# Patient Record
Sex: Male | Born: 1960 | Hispanic: Yes | Marital: Married | Smoking: Never smoker
Health system: Southern US, Community
[De-identification: ages and names within clinical notes are randomized; demographics above are authoritative.]

## PROBLEM LIST (undated history)

## (undated) DIAGNOSIS — G44221 Chronic tension-type headache, intractable: Secondary | ICD-10-CM

## (undated) DIAGNOSIS — I10 Essential (primary) hypertension: Secondary | ICD-10-CM

## (undated) DIAGNOSIS — D126 Benign neoplasm of colon, unspecified: Secondary | ICD-10-CM

## (undated) DIAGNOSIS — C189 Malignant neoplasm of colon, unspecified: Secondary | ICD-10-CM

## (undated) DIAGNOSIS — M19049 Primary osteoarthritis, unspecified hand: Secondary | ICD-10-CM

## (undated) DIAGNOSIS — K219 Gastro-esophageal reflux disease without esophagitis: Secondary | ICD-10-CM

## (undated) DIAGNOSIS — M5481 Occipital neuralgia: Secondary | ICD-10-CM

## (undated) DIAGNOSIS — M791 Myalgia, unspecified site: Secondary | ICD-10-CM

## (undated) DIAGNOSIS — R519 Headache, unspecified: Secondary | ICD-10-CM

## (undated) DIAGNOSIS — IMO0002 Reserved for concepts with insufficient information to code with codable children: Secondary | ICD-10-CM

## (undated) DIAGNOSIS — M1990 Unspecified osteoarthritis, unspecified site: Secondary | ICD-10-CM

## (undated) DIAGNOSIS — G709 Myoneural disorder, unspecified: Secondary | ICD-10-CM

## (undated) HISTORY — DX: Myalgia, unspecified site: M79.10

## (undated) HISTORY — DX: Reserved for concepts with insufficient information to code with codable children: IMO0002

## (undated) HISTORY — PX: COLON SURGERY: SHX602

## (undated) HISTORY — PX: LAPAROSCOPIC PARTIAL COLECTOMY: SHX5907

---

## 2004-07-13 ENCOUNTER — Ambulatory Visit: Payer: Self-pay | Admitting: Oncology

## 2004-08-05 ENCOUNTER — Ambulatory Visit: Payer: Self-pay | Admitting: Oncology

## 2005-01-01 ENCOUNTER — Ambulatory Visit: Payer: Self-pay | Admitting: Oncology

## 2005-01-03 ENCOUNTER — Ambulatory Visit: Payer: Self-pay | Admitting: Oncology

## 2005-01-28 ENCOUNTER — Ambulatory Visit: Payer: Self-pay | Admitting: Unknown Physician Specialty

## 2005-05-03 ENCOUNTER — Ambulatory Visit: Payer: Self-pay | Admitting: Oncology

## 2005-05-05 ENCOUNTER — Ambulatory Visit: Payer: Self-pay | Admitting: Oncology

## 2005-11-01 ENCOUNTER — Ambulatory Visit: Payer: Self-pay | Admitting: Oncology

## 2005-11-03 ENCOUNTER — Ambulatory Visit: Payer: Self-pay | Admitting: Oncology

## 2006-05-30 ENCOUNTER — Ambulatory Visit: Payer: Self-pay | Admitting: Oncology

## 2006-06-05 ENCOUNTER — Ambulatory Visit: Payer: Self-pay | Admitting: Oncology

## 2006-11-04 ENCOUNTER — Ambulatory Visit: Payer: Self-pay | Admitting: Oncology

## 2006-11-28 ENCOUNTER — Ambulatory Visit: Payer: Self-pay | Admitting: Oncology

## 2006-12-04 ENCOUNTER — Ambulatory Visit: Payer: Self-pay | Admitting: Oncology

## 2007-07-06 ENCOUNTER — Ambulatory Visit: Payer: Self-pay | Admitting: Oncology

## 2007-07-10 ENCOUNTER — Ambulatory Visit: Payer: Self-pay | Admitting: Oncology

## 2007-07-10 ENCOUNTER — Ambulatory Visit: Payer: Self-pay | Admitting: Physician Assistant

## 2007-08-06 ENCOUNTER — Ambulatory Visit: Payer: Self-pay | Admitting: Oncology

## 2007-10-04 ENCOUNTER — Ambulatory Visit: Payer: Self-pay | Admitting: Oncology

## 2008-01-04 ENCOUNTER — Ambulatory Visit: Payer: Self-pay | Admitting: Oncology

## 2008-01-20 ENCOUNTER — Ambulatory Visit: Payer: Self-pay | Admitting: Oncology

## 2008-02-03 ENCOUNTER — Ambulatory Visit: Payer: Self-pay | Admitting: Oncology

## 2008-05-05 ENCOUNTER — Ambulatory Visit: Payer: Self-pay | Admitting: Oncology

## 2008-05-16 ENCOUNTER — Ambulatory Visit: Payer: Self-pay | Admitting: Gastroenterology

## 2008-07-05 ENCOUNTER — Ambulatory Visit: Payer: Self-pay | Admitting: Oncology

## 2008-07-18 ENCOUNTER — Ambulatory Visit: Payer: Self-pay | Admitting: Oncology

## 2008-08-05 ENCOUNTER — Ambulatory Visit: Payer: Self-pay | Admitting: Oncology

## 2009-01-03 ENCOUNTER — Ambulatory Visit: Payer: Self-pay | Admitting: Oncology

## 2009-01-18 ENCOUNTER — Ambulatory Visit: Payer: Self-pay | Admitting: Oncology

## 2009-02-02 ENCOUNTER — Ambulatory Visit: Payer: Self-pay | Admitting: Oncology

## 2010-06-08 ENCOUNTER — Ambulatory Visit: Payer: Self-pay | Admitting: Oncology

## 2010-07-05 ENCOUNTER — Ambulatory Visit: Payer: Self-pay | Admitting: Oncology

## 2010-07-17 ENCOUNTER — Ambulatory Visit: Payer: Self-pay | Admitting: Gastroenterology

## 2010-08-05 DIAGNOSIS — C189 Malignant neoplasm of colon, unspecified: Secondary | ICD-10-CM

## 2010-08-05 HISTORY — DX: Malignant neoplasm of colon, unspecified: C18.9

## 2013-12-23 ENCOUNTER — Ambulatory Visit (INDEPENDENT_AMBULATORY_CARE_PROVIDER_SITE_OTHER): Payer: Managed Care, Other (non HMO)

## 2013-12-23 VITALS — BP 131/75 | HR 59 | Resp 18

## 2013-12-23 DIAGNOSIS — B351 Tinea unguium: Secondary | ICD-10-CM

## 2013-12-23 DIAGNOSIS — L299 Pruritus, unspecified: Secondary | ICD-10-CM

## 2013-12-23 DIAGNOSIS — B353 Tinea pedis: Secondary | ICD-10-CM

## 2013-12-23 MED ORDER — EFINACONAZOLE 10 % EX SOLN: CUTANEOUS | Status: AC

## 2013-12-23 MED ORDER — LULICONAZOLE 1 % EX CREA: TOPICAL_CREAM | CUTANEOUS | Status: AC

## 2013-12-23 NOTE — Progress Notes (Signed)
   Subjective:    Patient ID: Colton Johnston, male    DOB: [birth year], 53 y.o.   MRN: [id number]  HPI I have some nails that look discolored and thick and my feet and hands itch and some burning and throbbing and if it gets hot outside that is really when it gets bad    Review of Systems  All other systems reviewed and are negative.      Objective:   Physical Exam Neurovascular status is intact with pedal pulses palpable DP and PT posterior were for capillary refill time 3 seconds epicritic and proprioceptive sensations intact and symmetric bilateral there is normal plantar response DTRs not elicited dermatologically skin color pigment normal hair growth absent distally although intact and legs and ankle areas nails there is some thickening discoloration and brittleness hallux second fourth and fifth nails on the left second fourth and fifth nails on the right multiple nails are involved thickening yellowing discoloration and brittleness and tenderness also adjacent to the nails the skin and the webspaces and oxygen saturation of both feet is significant erythema and pruritus no open wounds no blistering no secondary infections or ulcers are noted patient also having some irritation to the nails on his hands and posse some right is as well maybe a hand infection due to him scratching his feet with secondary contamination may address this is secondary infection at the same time with current medications. Orthopedic biomechanical exam unremarkable neurologic exam unremarkable patient does have some neck or back problems been taking some pain medications no other new changes or findings noted       Assessment & Plan:  Assessment onychomycosis as well as chronic tinea pedis with pruritus plan at this time prescribed topical antifungals for nail and for skin prescription for Jublia and Luzu are given at this time we've sent the patient home directly advised to management medications as  instructed to 4 weeks for the cream for skin 12 months for fungal nail treatment followup in 6 months or as needed if there is any exacerbation or for possible followup in 6 months to assess progress next  [employer] DPM

## 2013-12-23 NOTE — Patient Instructions (Signed)
Onychomycosis/Fungal Toenails  WHAT IS IT? An infection that lies within the keratin of your nail plate that is caused by a fungus.  WHY ME? Fungal infections affect all ages, sexes, races, and creeds.  There may be many factors that predispose you to a fungal infection such as age, coexisting medical conditions such as diabetes, or an autoimmune disease; stress, medications, fatigue, genetics, etc.  Bottom line: fungus thrives in a warm, moist environment and your shoes offer such a location.  IS IT CONTAGIOUS? Theoretically, yes.  You do not want to share shoes, nail clippers or files with someone who has fungal toenails.  Walking around barefoot in the same room or sleeping in the same bed is unlikely to transfer the organism.  It is important to realize, however, that fungus can spread easily from one nail to the next on the same foot.  HOW DO WE TREAT THIS?  There are several ways to treat this condition.  Treatment may depend on many factors such as age, medications, pregnancy, liver and kidney conditions, etc.  It is best to ask your doctor which options are available to you.  1. No treatment.   Unlike many other medical concerns, you can live with this condition.  However for many people this can be a painful condition and may lead to ingrown toenails or a bacterial infection.  It is recommended that you keep the nails cut short to help reduce the amount of fungal nail. 2. Topical treatment.  These range from herbal remedies to prescription strength nail lacquers.  About 40-50% effective, topicals require twice daily application for approximately 9 to 12 months or until an entirely new nail has grown out.  The most effective topicals are medical grade medications available through physicians offices. 3. Oral antifungal medications.  With an 80-90% cure rate, the most common oral medication requires 3 to 4 months of therapy and stays in your system for a year as the new nail grows out.  Oral  antifungal medications do require blood work to make sure it is a safe drug for you.  A liver function panel will be performed prior to starting the medication and after the first month of treatment.  It is important to have the blood work performed to avoid any harmful side effects.  In general, this medication safe but blood work is required. 4. Laser Therapy.  This treatment is performed by applying a specialized laser to the affected nail plate.  This therapy is noninvasive, fast, and non-painful.  It is not covered by insurance and is therefore, out of pocket.  The results have been very good with a 80-95% cure rate.  [facility] is the only practice in the area to offer this therapy. 5. Permanent Nail Avulsion.  Removing the entire nail so that a new nail will not grow back.  Applied at topical nail antifungal Jublia to each affected toenail once daily for 12 months Applied to Luzu antifungal cream to the affected areas of both feet twice daily for 2-4 weeks  Discontinue any the products if the cause any irritation to the skin and contact our office

## 2014-12-29 ENCOUNTER — Ambulatory Visit (INDEPENDENT_AMBULATORY_CARE_PROVIDER_SITE_OTHER): Payer: Managed Care, Other (non HMO) | Admitting: Podiatrist

## 2014-12-29 ENCOUNTER — Encounter: Payer: Self-pay | Admitting: Podiatrist

## 2014-12-29 VITALS — BP 124/77 | HR 88 | Resp 18

## 2014-12-29 DIAGNOSIS — L600 Ingrowing nail: Secondary | ICD-10-CM | POA: Diagnosis not present

## 2014-12-29 NOTE — Patient Instructions (Signed)
You may choose your soak instructions below-- either betadine or antibacterial soap-- both work well .  Keep the toe clean and keep a bandage on it daily for 2 weeks.  May leave open to air after 1 week.   Betadine Soak Instructions  Purchase an 8 oz. bottle of BETADINE solution (Povidone)  THE DAY AFTER THE PROCEDURE  Place 1 tablespoon of betadine solution in a quart of warm tap water.  Submerge your foot or feet with outer bandage intact for the initial soak; this will allow the bandage to become moist and wet for easy lift off.  Once you remove your bandage, continue to soak in the solution for 20 minutes.  This soak should be done twice a day.  Next, remove your foot or feet from solution, blot dry the affected area and cover.  You may use a band aid large enough to cover the area or use gauze and tape.  Apply other medications to the area as directed by the doctor such as cortisporin otic solution (ear drops) or neosporin.  IF YOUR SKIN BECOMES IRRITATED WHILE USING THESE INSTRUCTIONS, IT IS OKAY TO SWITCH TO EPSOM SALTS AND WATER OR WHITE VINEGAR AND WATER.  ANTIBACTERIAL SOAP INSTRUCTIONS  THE DAY AFTER PROCEDURE   For the first dressing change (soak) Place 3-4 drops of antibacterial liquid soap in a quart of warm tap water.  Submerge foot into water for 20 minutes.  If bandage was applied after your procedure, leave on to allow for easy lift off, then remove and continue with soak for the remaining time.  Next, blot area dry with a soft cloth and apply antibiotic ointment such as polysporin, neosporin, or triple antibiotic ointment.  You may then switch to the instructions below    Shower as usual. Before getting out, place a drop of antibacterial liquid soap (Dial) on a wet, clean washcloth.  Gently wipe washcloth over affected area.  Afterward, rinse the area with warm water.  Blot the area dry with a soft cloth and cover with antibiotic ointment (neosporin, polysporin, bacitracin)  and band aid or gauze and tape    Long Term Care Instructions-Post Nail Surgery  You have had your ingrown toenail and root treated with a chemical.  This chemical causes a burn that will drain and ooze like a blister.  1-2 weeks after the procedure you may leave the area open to air at night to help dry it up.  During the day,  It is important to keep this area clean and covered until the toe dries out and forms a scab. Once the scab forms you no longer need to soak or apply a dressing.  If at any time you experience an increase in pain, redness, swelling, or drainage, you should contact the office as soon as possible.

## 2014-12-29 NOTE — Progress Notes (Signed)
   Chief Complaint  Patient presents with  . Ingrown Toenail    i have an ingrown toenail on the left big toe and i have trimmed on it and has been going on for about 4 months and is sore and draining and is tender and hurts with shoes        Review of Systems  DATA OBTAINED: from patient  GENERAL: Feels well no fevers, no fatigue, no changes in appetite SKIN: No itching, no rashes, no open wounds EYES: No eye pain,no redness, no discharge EARS: No earache,no ringing of ears, NOSE: No congestion, no drainage, no bleeding  MOUTH/THROAT: No mouth pain, No sore throat, No difficulty chewing or swallowing  RESPIRATORY: No cough, no wheezing, no SOB CARDIAC: No chest pain,no heart palpitations, GI: No abdominal pain, No Nausea, no vomiting, no diarrhea, no heartburn or no reflux  GU: No dysuria, no increased frequency or urgency MUSCULOSKELETAL: No unrelieved bone/joint pain,  NEUROLOGIC: Awake, alert, appropriate to situation, No change in mental status. PSYCHIATRIC: No overt anxiety or sadness.No behavior issue.      Physical Exam  GENERAL APPEARANCE: Alert, conversant. Appropriately groomed. No acute distress.  VASCULAR: Pedal pulses palpable at 2/4 DP and PT bilateral.  Capillary refill time is immediate to all digits,  Proximal to distal cooling it warm to warm.  Digital hair growth is present bilateral  NEUROLOGIC: sensation is intact epicritically and protectively to 5.07 monofilament at 5/5 sites bilateral.  Light touch is intact bilateral, vibratory sensation intact bilateral, achilles tendon reflex is intact bilateral.  MUSCULOSKELETAL: acceptable muscle strength, tone and stability bilateral.  Intrinsic muscluature intact bilateral.  Rectus appearance of foot and digits noted bilateral.   DERMATOLOGIC: skin color, texture, and turger are within normal limits.  No preulcerative lesions are seen, no interdigital maceration noted.  No open lesions present.   NAIL:  Left  hallux ingrown nail medial side.  Redness and swelling is noted on the medial side of the nail and pain with pressure is present  There are no active problems to display for this patient.   Assessment   Ingrown toenail left hallux medial side  Plan  Treatment options and alternatives discussed.  Recommended permanent phenol matrixectomy and patient agreed.  Left halux medial side was prepped with alcohol and a 1 to 1 mix of 0.5% marcaine plain and 2% lidocaine plain was administered in a digital block fashion.  The toe was then prepped with betadine solution and exsanguinated.  The offending nail border was then excised and matrix tissue exposed.  Phenol was then applied to the matrix tissue followed by an alcohol wash.  Antibiotic ointment and a dry sterile dressing was applied.  The patient was dispensed instructions for aftercare.

## 2015-08-16 ENCOUNTER — Other Ambulatory Visit: Payer: Self-pay | Admitting: Nurse Practitioner

## 2015-08-16 DIAGNOSIS — R1032 Left lower quadrant pain: Secondary | ICD-10-CM

## 2015-08-25 ENCOUNTER — Ambulatory Visit: Payer: Self-pay

## 2015-08-28 ENCOUNTER — Ambulatory Visit
Admission: RE | Admit: 2015-08-28 | Discharge: 2015-08-28 | Disposition: A | Discharge status: Home / Self Care | Payer: Managed Care, Other (non HMO) | Source: Ambulatory Visit | Attending: Nurse Practitioner | Admitting: Nurse Practitioner

## 2015-08-28 DIAGNOSIS — Z85038 Personal history of other malignant neoplasm of large intestine: Secondary | ICD-10-CM | POA: Insufficient documentation

## 2015-08-28 DIAGNOSIS — R1032 Left lower quadrant pain: Secondary | ICD-10-CM | POA: Diagnosis not present

## 2015-08-28 DIAGNOSIS — R935 Abnormal findings on diagnostic imaging of other abdominal regions, including retroperitoneum: Secondary | ICD-10-CM | POA: Insufficient documentation

## 2015-08-28 HISTORY — DX: Malignant neoplasm of colon, unspecified: C18.9

## 2015-08-28 MED ORDER — IOHEXOL 300 MG/ML  SOLN
100.0000 mL | Freq: Once | INTRAMUSCULAR | Status: AC | PRN
  Administered 2015-08-28: 100 mL via INTRAVENOUS

## 2015-09-07 ENCOUNTER — Encounter: Payer: Self-pay | Admitting: *Deleted

## 2015-09-08 ENCOUNTER — Ambulatory Visit: Payer: Managed Care, Other (non HMO) | Admitting: Anesthesiology

## 2015-09-08 ENCOUNTER — Encounter
Admission: RE | Disposition: A | Discharge status: Home / Self Care | Payer: Self-pay | Source: Ambulatory Visit | Attending: Gastroenterology

## 2015-09-08 ENCOUNTER — Encounter: Payer: Self-pay | Admitting: *Deleted

## 2015-09-08 ENCOUNTER — Ambulatory Visit
Admission: RE | Admit: 2015-09-08 | Discharge: 2015-09-08 | Disposition: A | Discharge status: Home / Self Care | Payer: Managed Care, Other (non HMO) | Source: Ambulatory Visit | Attending: Gastroenterology | Admitting: Gastroenterology

## 2015-09-08 DIAGNOSIS — Z9049 Acquired absence of other specified parts of digestive tract: Secondary | ICD-10-CM | POA: Insufficient documentation

## 2015-09-08 DIAGNOSIS — R1013 Epigastric pain: Secondary | ICD-10-CM | POA: Insufficient documentation

## 2015-09-08 DIAGNOSIS — Z86010 Personal history of colonic polyps: Secondary | ICD-10-CM | POA: Insufficient documentation

## 2015-09-08 DIAGNOSIS — Z85038 Personal history of other malignant neoplasm of large intestine: Secondary | ICD-10-CM | POA: Insufficient documentation

## 2015-09-08 DIAGNOSIS — D125 Benign neoplasm of sigmoid colon: Secondary | ICD-10-CM | POA: Insufficient documentation

## 2015-09-08 DIAGNOSIS — R1032 Left lower quadrant pain: Secondary | ICD-10-CM | POA: Diagnosis present

## 2015-09-08 DIAGNOSIS — Z79899 Other long term (current) drug therapy: Secondary | ICD-10-CM | POA: Diagnosis not present

## 2015-09-08 DIAGNOSIS — K210 Gastro-esophageal reflux disease with esophagitis: Secondary | ICD-10-CM | POA: Diagnosis not present

## 2015-09-08 HISTORY — PX: COLONOSCOPY WITH PROPOFOL: SHX5780

## 2015-09-08 HISTORY — PX: ESOPHAGOGASTRODUODENOSCOPY: SHX5428

## 2015-09-08 SURGERY — COLONOSCOPY WITH PROPOFOL
Anesthesia: General

## 2015-09-08 MED ORDER — SODIUM CHLORIDE 0.9 % IV SOLN
INTRAVENOUS | Status: DC
Start: 2015-09-08 — End: 2015-09-08

## 2015-09-08 MED ORDER — PROPOFOL 10 MG/ML IV BOLUS
INTRAVENOUS | Status: DC | PRN
  Administered 2015-09-08: 30 mg via INTRAVENOUS

## 2015-09-08 MED ORDER — FENTANYL CITRATE (PF) 100 MCG/2ML IJ SOLN
INTRAMUSCULAR | Status: DC | PRN
  Administered 2015-09-08: 50 ug via INTRAVENOUS

## 2015-09-08 MED ORDER — PROPOFOL 500 MG/50ML IV EMUL
INTRAVENOUS | Status: DC | PRN
  Administered 2015-09-08: 120 ug/kg/min via INTRAVENOUS

## 2015-09-08 MED ORDER — SODIUM CHLORIDE 0.9 % IV SOLN
INTRAVENOUS | Status: DC
  Administered 2015-09-08: 10:00:00 via INTRAVENOUS

## 2015-09-08 MED ORDER — MIDAZOLAM HCL 2 MG/2ML IJ SOLN
INTRAMUSCULAR | Status: DC | PRN
  Administered 2015-09-08: 1 mg via INTRAVENOUS

## 2015-09-08 NOTE — H&P (Signed)
  Date of Initial H&P: 08/16/2015  History reviewed, patient examined, no change in status, stable for surgery.

## 2015-09-08 NOTE — Anesthesia Postprocedure Evaluation (Signed)
Anesthesia Post Note  Patient: Colton Johnston  Procedure(s) Performed: Procedure(s) (LRB): COLONOSCOPY WITH PROPOFOL (N/A) ESOPHAGOGASTRODUODENOSCOPY (EGD) (N/A)  Patient location during evaluation: PACU Anesthesia Type: General Level of consciousness: awake Pain management: satisfactory to patient Vital Signs Assessment: post-procedure vital signs reviewed and stable Respiratory status: nonlabored ventilation Cardiovascular status: stable Anesthetic complications: no    Last Vitals:  Filed Vitals:   09/08/15 1112 09/08/15 1122  BP: 127/80 116/77  Pulse: 59 62  Temp:    Resp: 11 13    Last Pain: There were no vitals filed for this visit.               VAN STAVEREN,Yonathan Perrow

## 2015-09-08 NOTE — Op Note (Signed)
[facility] Patient Name: Colton Johnston Procedure Date: 09/08/2015 10:22 AM MRN: [id number] Account #: [id number] Date of Birth: 09/02/1960 Admit Type: Outpatient Age: 55 Room: [facility] ENDO ROOM 4 Gender: Male Note Status: Finalized Procedure:         Upper GI endoscopy Indications:       Epigastric abdominal pain, Suspected esophageal reflux Providers:         Lupita Dawn. Candace Cruise, MD Referring MD:      Lorin Mercy. Hamrick, MD (Referring MD) Medicines:         Monitored Anesthesia Care Complications:     No immediate complications. Procedure:         Pre-Anesthesia Assessment:                    - Prior to the procedure, a History and Physical was                     performed, and patient medications, allergies and                     sensitivities were reviewed. The patient's tolerance of                     previous anesthesia was reviewed.                    - The risks and benefits of the procedure and the sedation                     options and risks were discussed with the patient. All                     questions were answered and informed consent was obtained.                    - After reviewing the risks and benefits, the patient was                     deemed in satisfactory condition to undergo the procedure.                    After obtaining informed consent, the endoscope was passed                     under direct vision. Throughout the procedure, the                     patient's blood pressure, pulse, and oxygen saturations                     were monitored continuously. The Endoscope was introduced                     through the mouth, and advanced to the second part of                     duodenum. The upper GI endoscopy was accomplished without                     difficulty. The patient tolerated the procedure well. Findings:      LA Grade A (one or more mucosal breaks less than 5 mm, not extending       between tops of 2 mucosal folds)  esophagitis was found at  the       gastroesophageal junction. Biopsies were taken with a cold forceps for       histology.      The exam was otherwise without abnormality.      The entire examined stomach was normal.      The examined duodenum was normal. Impression:        - LA Grade A reflux esophagitis. Biopsied.                    - The examination was otherwise normal.                    - Normal stomach.                    - Normal examined duodenum. Recommendation:    - Discharge patient to home.                    - Observe patient's clinical course.                    - Continue present medications.                    - The findings and recommendations were discussed with the                     patient. Procedure Code(s): --- Professional ---                    [phone], Esophagogastroduodenoscopy, flexible, transoral;                     with biopsy, single or multiple Diagnosis Code(s): --- Professional ---                    K21.0, Gastro-esophageal reflux disease with esophagitis                    R10.13, Epigastric pain CPT copyright 2014 American Medical Association. All rights reserved. The codes documented in this report are preliminary and upon coder review may  be revised to meet current compliance requirements. [name], MD 09/08/2015 10:31:36 AM This report has been signed electronically. Number of Addenda: 0 Note Initiated On: 09/08/2015 10:22 AM      [facility]

## 2015-09-08 NOTE — Op Note (Signed)
[facility] Patient Name: Colton Johnston Procedure Date: 09/08/2015 10:22 AM MRN: [id number] Account #: [id number] Date of Birth: 1961-01-27 Admit Type: Outpatient Age: 55 Room: [facility] ENDO ROOM 4 Gender: Male Note Status: Finalized Procedure:         Colonoscopy Indications:       Abdominal pain in the left lower quadrant, Personal                     history of malignant neoplasm of the colon, Personal                     history of colonic polyps Providers:         Lupita Dawn. Candace Cruise, MD Referring MD:      Lorin Mercy. Hamrick, MD (Referring MD) Medicines:         Monitored Anesthesia Care Complications:     No immediate complications. Procedure:         Pre-Anesthesia Assessment:                    - Prior to the procedure, a History and Physical was                     performed, and patient medications, allergies and                     sensitivities were reviewed. The patient's tolerance of                     previous anesthesia was reviewed.                    - The risks and benefits of the procedure and the sedation                     options and risks were discussed with the patient. All                     questions were answered and informed consent was obtained.                    - After reviewing the risks and benefits, the patient was                     deemed in satisfactory condition to undergo the procedure.                    After obtaining informed consent, the colonoscope was                     passed under direct vision. Throughout the procedure, the                     patient's blood pressure, pulse, and oxygen saturations                     were monitored continuously. The Olympus PCF-H180AL                     colonoscope ( S#: [id number] ) was introduced through the                     anus and advanced to the the cecum, identified by  appendiceal orifice and ileocecal valve. The colonoscopy                      was performed without difficulty. The patient tolerated                     the procedure well. The quality of the bowel preparation                     was fair. Findings:      A medium polyp was found in the sigmoid colon. The polyp was       pedunculated. The polyp was removed with a hot snare. Resection and       retrieval were complete. To prevent bleeding after the polypectomy, one       hemostatic clip was successfully placed. There was no bleeding at the       end of the procedure. Intact surgical anastomosis in distal sigmoid       colon.      The exam was otherwise without abnormality. Impression:        - One medium polyp in the sigmoid colon. Resected and                     retrieved. Clip was placed.                    - The examination was otherwise normal. Recommendation:    - Discharge patient to home.                    - Await pathology results.                    - Repeat colonoscopy in 5 years for surveillance based on                     pathology results.                    - The findings and recommendations were discussed with the                     patient. Procedure Code(s): --- Professional ---                    [phone], Colonoscopy, flexible; with removal of tumor(s),                     polyp(s), or other lesion(s) by snare technique Diagnosis Code(s): --- Professional ---                    D12.5, Benign neoplasm of sigmoid colon                    R10.32, Left lower quadrant pain                    Z85.038, Personal history of other malignant neoplasm of                     large intestine                    Z86.010, Personal history of colonic polyps CPT copyright 2014 American Medical Association. All rights reserved. The codes documented in this report are preliminary and upon coder review may  be revised to meet  current compliance requirements. [name], MD 09/08/2015 10:48:24 AM This report has been signed electronically. Number of Addenda:  0 Note Initiated On: 09/08/2015 10:22 AM Scope Withdrawal Time: 0 hours 7 minutes 1 second  Total Procedure Duration: 0 hours 10 minutes 45 seconds       [facility]

## 2015-09-08 NOTE — Anesthesia Preprocedure Evaluation (Signed)
Anesthesia Evaluation  Patient identified by MRN, date of birth, ID band Patient awake    Reviewed: Allergy & Precautions, NPO status , Patient's Chart, lab work & pertinent test results  Airway Mallampati: II       Dental no notable dental hx.    Pulmonary neg pulmonary ROS,    Pulmonary exam normal        Cardiovascular negative cardio ROS   Rhythm:Regular Rate:Normal     Neuro/Psych    GI/Hepatic negative GI ROS, Neg liver ROS,   Endo/Other  negative endocrine ROS  Renal/GU negative Renal ROS     Musculoskeletal   Abdominal Normal abdominal exam  (+)   Peds negative pediatric ROS (+)  Hematology negative hematology ROS (+)   Anesthesia Other Findings   Reproductive/Obstetrics                             Anesthesia Physical Anesthesia Plan  ASA: II  Anesthesia Plan: General   Post-op Pain Management:    Induction: Intravenous  Airway Management Planned: Natural Airway and Nasal Cannula  Additional Equipment:   Intra-op Plan:   Post-operative Plan:   Informed Consent: I have reviewed the patients History and Physical, chart, labs and discussed the procedure including the risks, benefits and alternatives for the proposed anesthesia with the patient or authorized representative who has indicated his/her understanding and acceptance.     Plan Discussed with: CRNA  Anesthesia Plan Comments:         Anesthesia Quick Evaluation

## 2015-09-08 NOTE — Transfer of Care (Signed)
Immediate Anesthesia Transfer of Care Note  Patient: Colton Johnston  Procedure(s) Performed: Procedure(s): COLONOSCOPY WITH PROPOFOL (N/A) ESOPHAGOGASTRODUODENOSCOPY (EGD) (N/A)  Patient Location: PACU  Anesthesia Type:General  Level of Consciousness: awake and alert   Airway & Oxygen Therapy: Patient Spontanous Breathing and Patient connected to nasal cannula oxygen  Post-op Assessment: Report given to RN  Post vital signs: Reviewed and stable  Last Vitals:  Filed Vitals:   09/08/15 0945  BP: 125/67  Pulse: 55  Temp: 36.9 C  Resp: 14    Complications: No apparent anesthesia complications

## 2015-09-11 LAB — SURGICAL PATHOLOGY

## 2019-01-14 ENCOUNTER — Other Ambulatory Visit: Payer: Self-pay | Admitting: Internal Medicine

## 2019-01-14 DIAGNOSIS — K2100 Gastro-esophageal reflux disease with esophagitis, without bleeding: Secondary | ICD-10-CM

## 2019-01-14 DIAGNOSIS — R1010 Upper abdominal pain, unspecified: Secondary | ICD-10-CM

## 2019-01-25 ENCOUNTER — Ambulatory Visit: Payer: 59

## 2019-02-01 ENCOUNTER — Ambulatory Visit
Admission: RE | Admit: 2019-02-01 | Discharge: 2019-02-01 | Disposition: A | Discharge status: Home / Self Care | Payer: 59 | Source: Ambulatory Visit | Attending: Internal Medicine | Admitting: Internal Medicine

## 2019-02-01 ENCOUNTER — Other Ambulatory Visit: Payer: Self-pay

## 2019-02-01 DIAGNOSIS — K2100 Gastro-esophageal reflux disease with esophagitis, without bleeding: Secondary | ICD-10-CM

## 2019-02-01 DIAGNOSIS — R1010 Upper abdominal pain, unspecified: Secondary | ICD-10-CM | POA: Insufficient documentation

## 2019-02-01 MED ORDER — IOHEXOL 300 MG/ML  SOLN
75.0000 mL | Freq: Once | INTRAMUSCULAR | Status: AC | PRN
  Administered 2019-02-01: 75 mL via INTRAVENOUS

## 2019-07-08 ENCOUNTER — Other Ambulatory Visit: Payer: Self-pay

## 2019-07-13 ENCOUNTER — Other Ambulatory Visit
Admission: RE | Admit: 2019-07-13 | Discharge: 2019-07-13 | Disposition: A | Discharge status: Home / Self Care | Payer: 59 | Source: Ambulatory Visit | Attending: Unknown Physician Specialty | Admitting: Unknown Physician Specialty

## 2019-07-13 DIAGNOSIS — Z01812 Encounter for preprocedural laboratory examination: Secondary | ICD-10-CM | POA: Insufficient documentation

## 2019-07-13 DIAGNOSIS — Z20828 Contact with and (suspected) exposure to other viral communicable diseases: Secondary | ICD-10-CM | POA: Insufficient documentation

## 2019-07-13 NOTE — Discharge Instructions (Signed)
Amigdalectoma en los adultos, cuidados posteriores Tonsillectomy, Adult, Care After Lea esta informacin sobre cmo cuidarse despus del procedimiento. El mdico tambin podr darle indicaciones ms especficas. Si tiene problemas o preguntas, llame al mdico. Siga estas indicaciones en su casa: Comida y bebida   Siga las indicaciones del mdico respecto de las comidas y las bebidas.  Despus de la [place] y [place], elija los alimentos que son Lindaann Pascal y fros. Algunos ejemplos son: ? Woody Seller. ? Sorbete. ? Helados. ? Helados de agua.  Si tiene Higher education careers adviser (tiene nuseas), opte por lquidos fros y trasparentes (lquidos claros). Por ejemplo: agua y [place] sin pulpa. Puede probar lquidos espesos y comidas suaves cuando pueda comer sin vomitar y sin sentir Psychologist, occupational. Algunos ejemplos son: ? Sopas cremosas. ? Cereales suaves y calientes, como avena o cereal de trigo caliente. ? Leche. ? Pur de papas. ? Pur de [employer].  Beba suficiente lquido como para mantener el pis (orina) claro o de color amarillo plido. Conducir  No conduzca durante 24horas si le dieron un medicamento para ayudarla a que se relaje (sedante).  No conduzca ni opere maquinaria pesada mientras toma analgsicos recetados o hasta que el mdico lo haya aprobado. Instrucciones generales  Reposo.  Mantenga la cabeza en alto (elevada) cuando est acostado.  Tome los medicamentos solamente como se lo haya indicado el mdico. Estos incluyen los medicamentos recetados y de [place].  No use enjuagues bucales hasta que el mdico lo autorice.  Haga grgaras solamente como se lo haya indicado el mdico.  Aljese de las personas enfermas. Comunquese con un mdico si:  El dolor empeora y los medicamentos no surten [place].  Tiene fiebre.  Tiene una erupcin cutnea.  Se siente mareada o se desvanece (se desmaya).  No puede tragar ni siquiera un poco de lquido o  saliva.  La orina es muy oscura. Solicite ayuda de inmediato si:  Tiene dificultad para respirar.  Tiene un sangrado de color rojo brillante por la garganta.  Vomita sangre roja brillante. [place] indicaciones del mdico respecto de lo que no puede comer o beber. Despus de la [place] y [place], elija los alimentos que son Lindaann Pascal y fros.  Hable con el mdico sobre cmo Financial controller. Esto puede ayudarle a descansar y tragar mejor.  Busque ayuda de inmediato si le sangra la garganta o si vomita sangre de color rojo brillante. Esta informacin no tiene Marine scientist el consejo del mdico. Asegrese de hacerle al mdico cualquier pregunta que tenga. Document Released: 11/06/2010 Document Revised: 02/25/2017 Document Reviewed: 02/25/2017 Elsevier Patient Education  2020 [place]  Beverly Gust, MD  [place], [place] [address] TEL.  [phone]  INFORMATION SHEET FOR A TONSILLECTOMY AND ADENDOIDECTOMY  About Your Tonsils and Adenoids  The tonsils and adenoids are normal body tissues that are part of our immune system.  They normally help to protect [place] against diseases that may enter our mouth and nose. However, sometimes the tonsils and/or adenoids become too large and obstruct our breathing, especially at night.    If either of these things happen it helps to remove the tonsils and adenoids in order to become healthier. The operation to remove the tonsils and adenoids is called a tonsillectomy and adenoidectomy.  The Location of Your Tonsils and Adenoids  The tonsils are located in the  back of the throat on both side and sit in a cradle of muscles. The adenoids are located in the roof of the mouth, behind the nose, and closely associated with the opening of the Eustachian tube to the ear.  Surgery on Tonsils and Adenoids  A  tonsillectomy and adenoidectomy is a short operation which takes about thirty minutes.  This includes being put to sleep and being awakened. Tonsillectomies and adenoidectomies are performed at [facility] and may require observation period in the recovery room prior to going home. Children are required to remain in recovery for at least 45 minutes.   Following the Operation for a Tonsillectomy  A cautery machine is used to control bleeding. Bleeding from a tonsillectomy and adenoidectomy is minimal and postoperatively the risk of bleeding is approximately four percent, although this rarely life threatening.  After your tonsillectomy and adenoidectomy post-op care at home: 1. Our patients are able to go home the same day. You may be given prescriptions for pain medications, if indicated. 2. It is extremely important to remember that fluid intake is of utmost importance after a tonsillectomy. The amount that you drink must be maintained in the postoperative period. A good indication of whether a child is getting enough fluid is whether his/her urine output is constant. As long as children are urinating or wetting their diaper every 6 - 8 hours this is usually enough fluid intake.   3. Although rare, this is a risk of some bleeding in the first ten days after surgery. This usually occurs between day five and nine postoperatively. This risk of bleeding is approximately four percent. If you or your child should have any bleeding you should remain calm and notify our office or go directly to the emergency room at [facility] where they will contact us. Our doctors are available seven days a week for notification. We recommend sitting up quietly in a chair, place an ice pack on the front of the neck and spitting out the blood gently until we are able to contact you. Adults should gargle gently with ice water and this may help stop the bleeding. If the bleeding does not stop after a  short time, i.e. 10 to 15 minutes, or seems to be increasing again, please contact us or go to the hospital.   4. It is common for the pain to be worse at 5 - 7 days postoperatively. This occurs because the scab is peeling off and the mucous membrane (skin of the throat) is growing back where the tonsils were.   5. It is common for a low-grade fever, less than 102, during the first week after a tonsillectomy and adenoidectomy. It is usually due to not drinking enough liquids, and we suggest your use liquid Tylenol (acetaminophen) or the pain medicine with Tylenol (acetaminophen) prescribed in order to keep your temperature below 102. Please follow the directions on the back of the bottle. 6. Recommendations for post-operative pain in children and adults: a) For Children 12 and younger: Recommendations are for oral Tylenol (acetaminophen) and oral Motrin (Ibuprofen). Administer the Tylenol (acetaminophen) and Motrin (ibuprofen) as stated on bottle for patient's age/weight. Sometimes it may be necessary to alternate the Tylenol (acetaminophen) and Motrin for improved pain control. Motrin does last slightly longer so many patients benefit from being given this prior to bedtime. All children should avoid Aspirin products for 2 weeks following surgery. b) For children over the age of 92: Tylenol (acetaminophen) is the preferred  first choice for pain control. Depending on your child's size, sometimes they will be given a combination of Tylenol (acetaminophen) and hydrocodone medication or sometimes it will be recommended they take Motrin (ibuprofen) in addition to the Tylenol (acetaminophen). Narcotics should always be used with caution in children following surgery as they can suppress their breathing and switching to over the counter Tylenol (acetaminophen) and Motrin (ibuprofen) as soon as possible is recommended. All patients should avoid Aspirin products for 2 weeks following surgery. c) Adults: Usually  adults will require a narcotic pain medication following a tonsillectomy. This usually has either hydrocodone or oxycodone in it and can usually be taken every 4 to 6 hours as needed for moderate pain. If the medication does not have Tylenol (acetaminophen) in it, you may also supplement Tylenol (acetaminophen) as needed every 4 to 6 hours for breakthrough or mild pain. Adults should avoid Aspirin, Aleve, Motrin, and Ibuprofen products for 2 weeks following surgery as they can increase your risk of bleeding. 7. If you happen to look in the mirror or into your child's mouth you will see white/gray patches on the back of the throat. This is what a scab looks like in the mouth and is normal after having a tonsillectomy and adenoidectomy. They will disappear once the tonsil areas heal completely. However, it may cause a noticeable odor, and this too will disappear with time.     8. You or your child may experience ear pain after having a tonsillectomy and adenoidectomy.  This is called referred pain and comes from the throat, but it is felt in the ears.  Ear pain is quite common and expected. It will usually go away after ten days. There is usually nothing wrong with the ears, and it is primarily due to the healing area stimulating the nerve to the ear that runs along the side of the throat. Use either the prescribed pain medicine or Tylenol (acetaminophen) as needed.  9. The throat tissues after a tonsillectomy are obviously sensitive. Smoking around children who have had a tonsillectomy significantly increases the risk of bleeding. DO NOT SMOKE!  Anestesia general en adultos, cuidados posteriores General Anesthesia, Adult, Care After Lea esta informacin sobre cmo cuidarse despus del procedimiento. El mdico tambin podr darle instrucciones ms especficas. Comunquese con su mdico si tiene problemas o preguntas. Qu puedo esperar despus del procedimiento? Luego del procedimiento, son comunes los  siguiente efectos secundarios:  Dolor o Scientist, research (life sciences) en el lugar de la va intravenosa (i.v.).  Nuseas.  Vmitos.  Dolor de Investment banker, operational.  Dificultad para concentrarse.  Sentir fro o [employer].  Debilidad o cansancio.  Somnolencia y Programmer, applications.  Malestar y Hydrologist. Estos efectos secundarios pueden afectar partes del cuerpo que no estuvieron involucradas en la ciruga. Siga estas indicaciones en su casa:  Durante al menos 24horas despus del procedimiento:  Pdale a un adulto responsable que permanezca con usted. Es importante que alguien cuide de usted hasta que se despierte y Cabin crew.  Descanse todo lo que sea necesario.  No haga lo siguiente: ? Participar en actividades en las que podra caerse o lastimarse. ? Conducir. ? Operar maquinarias pesadas. ? Beber alcohol. ? Tomar somnferos o medicamentos que causen somnolencia. ? Firmar documentos legales ni tomar [employer]. ? Cuidar a nios por su cuenta. Qu debe comer y beber  Siga las indicaciones del mdico respecto de las restricciones de comidas o bebidas.  Cuando tenga hambre, comience a comer cantidades pequeas de alimentos que sean blandos  y fciles de Publishing copy (livianos), como una tostada. Retome su dieta habitual de forma gradual.  Beba suficiente lquido como para mantener la orina de color amarillo plido.  Si vomita, rehidrtese tomando agua, jugo o caldo transparente. Instrucciones generales  Si tiene apnea del sueo, la [place] y ciertos medicamentos pueden aumentar el riesgo de problemas respiratorios. Siga las indicaciones del mdico respecto al uso de su dispositivo para dormir: ? Siempre que duerma, incluso durante las siestas que tome en el da. ? Mientras tome analgsicos recetados, medicamentos para dormir o medicamentos que producen somnolencia.  Reanude sus actividades normales segn lo indicado por el mdico. Pregntele al mdico qu actividades son seguras para  usted.  Tome los medicamentos de venta libre y los recetados solamente como se lo haya indicado el mdico.  Si fuma, no lo haga sin supervisin.  Concurra a todas las visitas de seguimiento como se lo haya indicado el mdico. Esto es importante. Comunquese con un mdico si:  Tiene nuseas o vmitos que no mejoran con medicamentos.  No puede comer ni beber sin vomitar.  El dolor no se alivia con medicamentos.  No puede orinar.  Tiene una erupcin cutnea.  Tiene fiebre.  Presenta enrojecimiento alrededor del lugar de la va intravenosa (i.v.) que empeora. Solicite ayuda de inmediato si:  Tiene dificultad para respirar.  Siente dolor en el pecho.  Observa sangre en la orina o heces, o vomita sangre. Resumen  Despus del procedimiento, es comn tener dolor de garganta y nuseas. Tambin es comn sentirse cansado.  Pdale a un adulto responsable que permanezca con usted durante 24 horas despus de la anestesia general. Es importante que alguien cuide de usted hasta que se despierte y Cabin crew.  Cuando [employer], comience a comer cantidades pequeas de alimentos que sean blandos y fciles de Publishing copy (livianos), como una tostada. Retome su dieta habitual de forma gradual.  Beba suficiente lquido como para mantener la orina de color amarillo plido.  Reanude sus actividades normales segn lo indicado por el mdico. Pregntele al mdico qu actividades son seguras para usted. Esta informacin no tiene Marine scientist el consejo del mdico. Asegrese de hacerle al mdico cualquier pregunta que tenga. Document Released: 07/22/2005 Document Revised: 05/19/2017 Document Reviewed: 05/19/2017 Elsevier Patient Education  2020 [employer] Anesthesia, Adult, Care After This sheet gives you information about how to care for yourself after your procedure. Your health care provider may also give you more specific instructions. If you have problems or questions,  contact your health care provider. What can I expect after the procedure? After the procedure, the following side effects are common:  Pain or discomfort at the IV site.  Nausea.  Vomiting.  Sore throat.  Trouble concentrating.  Feeling cold or chills.  Weak or tired.  Sleepiness and fatigue.  Soreness and body aches. These side effects can affect parts of the body that were not involved in surgery. Follow these instructions at home:  For at least 24 hours after the procedure:  Have a responsible adult stay with you. It is important to have someone help care for you until you are awake and alert.  Rest as needed.  Do not: ? Participate in activities in which you could fall or become injured. ? Drive. ? Use heavy machinery. ? Drink alcohol. ? Take sleeping pills or medicines that cause drowsiness. ? Make important decisions or sign legal documents. ? Take care of children on your own. Eating and drinking  Follow  any instructions from your health care provider about eating or drinking restrictions.  When you feel hungry, start by eating small amounts of foods that are soft and easy to digest (bland), such as toast. Gradually return to your regular diet.  Drink enough fluid to keep your urine pale yellow.  If you vomit, rehydrate by drinking water, juice, or clear broth. General instructions  If you have sleep apnea, surgery and certain medicines can increase your risk for breathing problems. Follow instructions from your health care provider about wearing your sleep device: ? Anytime you are sleeping, including during daytime naps. ? While taking prescription pain medicines, sleeping medicines, or medicines that make you drowsy.  Return to your normal activities as told by your health care provider. Ask your health care provider what activities are safe for you.  Take over-the-counter and prescription medicines only as told by your health care provider.  If you  smoke, do not smoke without supervision.  Keep all follow-up visits as told by your health care provider. This is important. Contact a health care provider if:  You have nausea or vomiting that does not get better with medicine.  You cannot eat or drink without vomiting.  You have pain that does not get better with medicine.  You are unable to pass urine.  You develop a skin rash.  You have a fever.  You have redness around your IV site that gets worse. Get help right away if:  You have difficulty breathing.  You have chest pain.  You have blood in your urine or stool, or you vomit blood. Summary  After the procedure, it is common to have a sore throat or nausea. It is also common to feel tired.  Have a responsible adult stay with you for the first 24 hours after general anesthesia. It is important to have someone help care for you until you are awake and alert.  When you feel hungry, start by eating small amounts of foods that are soft and easy to digest (bland), such as toast. Gradually return to your regular diet.  Drink enough fluid to keep your urine pale yellow.  Return to your normal activities as told by your health care provider. Ask your health care provider what activities are safe for you. This information is not intended to replace advice given to you by your health care provider. Make sure you discuss any questions you have with your health care provider. Document Released: 10/28/2000 Document Revised: 07/25/2017 Document Reviewed: 03/07/2017 Elsevier Patient Education  2020 [employer].

## 2019-07-14 LAB — SARS CORONAVIRUS 2 (TAT 6-24 HRS): SARS Coronavirus 2: NEGATIVE

## 2019-07-16 ENCOUNTER — Encounter: Payer: Self-pay | Admitting: Unknown Physician Specialty

## 2019-07-16 ENCOUNTER — Ambulatory Visit
Admission: RE | Admit: 2019-07-16 | Discharge: 2019-07-16 | Disposition: A | Discharge status: Home / Self Care | Payer: 59 | Attending: Unknown Physician Specialty | Admitting: Unknown Physician Specialty

## 2019-07-16 ENCOUNTER — Other Ambulatory Visit: Payer: Self-pay

## 2019-07-16 ENCOUNTER — Ambulatory Visit: Payer: 59 | Admitting: Anesthesiology

## 2019-07-16 ENCOUNTER — Encounter
Admission: RE | Disposition: A | Discharge status: Home / Self Care | Payer: Self-pay | Source: Home / Self Care | Attending: Unknown Physician Specialty

## 2019-07-16 DIAGNOSIS — J351 Hypertrophy of tonsils: Secondary | ICD-10-CM | POA: Insufficient documentation

## 2019-07-16 DIAGNOSIS — D104 Benign neoplasm of tonsil: Secondary | ICD-10-CM | POA: Insufficient documentation

## 2019-07-16 HISTORY — PX: TONSILLECTOMY: SHX5217

## 2019-07-16 HISTORY — DX: Myoneural disorder, unspecified: G70.9

## 2019-07-16 SURGERY — TONSILLECTOMY
Anesthesia: General | Site: Throat | Laterality: Bilateral

## 2019-07-16 MED ORDER — BUPIVACAINE HCL (PF) 0.5 % IJ SOLN
INTRAMUSCULAR | Status: DC | PRN
  Administered 2019-07-16: 9 mL

## 2019-07-16 MED ORDER — ACETAMINOPHEN 10 MG/ML IV SOLN
1000.0000 mg | Freq: Once | INTRAVENOUS | Status: AC
  Administered 2019-07-16: 1000 mg via INTRAVENOUS

## 2019-07-16 MED ORDER — DEXAMETHASONE SODIUM PHOSPHATE 4 MG/ML IJ SOLN
INTRAMUSCULAR | Status: DC | PRN
  Administered 2019-07-16: 8 mg via INTRAVENOUS

## 2019-07-16 MED ORDER — ONDANSETRON HCL 4 MG/2ML IJ SOLN: 4.0000 mg | Freq: Once | INTRAMUSCULAR | Status: DC | PRN

## 2019-07-16 MED ORDER — FENTANYL CITRATE (PF) 100 MCG/2ML IJ SOLN: 25.0000 ug | INTRAMUSCULAR | Status: DC | PRN

## 2019-07-16 MED ORDER — LACTATED RINGERS IV SOLN
100.0000 mL/h | INTRAVENOUS | Status: DC
  Administered 2019-07-16: 09:00:00 100 mL/h via INTRAVENOUS

## 2019-07-16 MED ORDER — MIDAZOLAM HCL 5 MG/5ML IJ SOLN
INTRAMUSCULAR | Status: DC | PRN
  Administered 2019-07-16: 2 mg via INTRAVENOUS

## 2019-07-16 MED ORDER — OXYCODONE HCL 5 MG PO TABS: 5.0000 mg | ORAL_TABLET | Freq: Once | ORAL | Status: AC | PRN

## 2019-07-16 MED ORDER — LIDOCAINE HCL (CARDIAC) PF 100 MG/5ML IV SOSY
PREFILLED_SYRINGE | INTRAVENOUS | Status: DC | PRN
  Administered 2019-07-16: 30 mg via INTRAVENOUS

## 2019-07-16 MED ORDER — ONDANSETRON HCL 4 MG/2ML IJ SOLN
INTRAMUSCULAR | Status: DC | PRN
  Administered 2019-07-16: 4 mg via INTRAVENOUS

## 2019-07-16 MED ORDER — PROPOFOL 10 MG/ML IV BOLUS
INTRAVENOUS | Status: DC | PRN
  Administered 2019-07-16: 160 mg via INTRAVENOUS

## 2019-07-16 MED ORDER — SCOPOLAMINE 1 MG/3DAYS TD PT72
1.0000 | MEDICATED_PATCH | Freq: Once | TRANSDERMAL | Status: DC
  Administered 2019-07-16: 1.5 mg via TRANSDERMAL

## 2019-07-16 MED ORDER — FENTANYL CITRATE (PF) 100 MCG/2ML IJ SOLN
INTRAMUSCULAR | Status: DC | PRN
  Administered 2019-07-16: 100 ug via INTRAVENOUS

## 2019-07-16 MED ORDER — SUCCINYLCHOLINE CHLORIDE 20 MG/ML IJ SOLN
INTRAMUSCULAR | Status: DC | PRN
  Administered 2019-07-16: 120 mg via INTRAVENOUS

## 2019-07-16 MED ORDER — OXYCODONE HCL 5 MG/5ML PO SOLN
5.0000 mg | Freq: Once | ORAL | Status: AC | PRN
  Administered 2019-07-16: 5 mg via ORAL

## 2019-07-16 MED ORDER — HYDROCODONE-ACETAMINOPHEN 7.5-325 MG/15ML PO SOLN: 15.0000 mL | Freq: Four times a day (QID) | ORAL | 0 refills | Status: AC | PRN

## 2019-07-16 SURGICAL SUPPLY — 19 items
CANISTER SUCT 1200ML W/VALVE (MISCELLANEOUS) ×2 IMPLANT
COAG SUCT 10F 3.5MM HAND CTRL (MISCELLANEOUS) ×2 IMPLANT
DRAPE HEAD BAR (DRAPES) ×2 IMPLANT
ELECT CAUTERY BLADE TIP 2.5 (TIP) ×2
ELECT REM PT RETURN 9FT ADLT (ELECTROSURGICAL) ×2
ELECTRODE CAUTERY BLDE TIP 2.5 (TIP) ×1 IMPLANT
ELECTRODE REM PT RTRN 9FT ADLT (ELECTROSURGICAL) ×1 IMPLANT
GLOVE BIO SURGEON STRL SZ7.5 (GLOVE) ×2 IMPLANT
HANDLE SUCTION POOLE (INSTRUMENTS) ×1 IMPLANT
KIT TURNOVER KIT A (KITS) ×2 IMPLANT
NEEDLE HYPO 25GX1X1/2 BEV (NEEDLE) ×2 IMPLANT
NS IRRIG 500ML POUR BTL (IV SOLUTION) ×2 IMPLANT
PACK TONSIL AND ADENOID CUSTOM (PACKS) ×2 IMPLANT
PENCIL ELECTRO HAND CTR (MISCELLANEOUS) ×2 IMPLANT
SPONGE TONSIL .75 RFD DBL STRL (DISPOSABLE) ×2 IMPLANT
STRAP BODY AND KNEE 60X3 (MISCELLANEOUS) ×2 IMPLANT
SUCTION POOLE HANDLE (INSTRUMENTS) ×2
SYR 10ML LL (SYRINGE) IMPLANT
SYR 5ML LL (SYRINGE) ×2 IMPLANT

## 2019-07-16 NOTE — H&P (Signed)
The patient's history has been reviewed, patient examined, no change in status, stable for surgery.  Questions were answered to the patients satisfaction.  

## 2019-07-16 NOTE — Anesthesia Preprocedure Evaluation (Signed)
Anesthesia Evaluation  Patient identified by MRN, date of birth, ID band Patient awake    Reviewed: Allergy & Precautions, H&P , NPO status , Patient's Chart, lab work & pertinent test results  Airway Mallampati: II  TM Distance: >3 FB Neck ROM: full    Dental no notable dental hx.    Pulmonary    Pulmonary exam normal breath sounds clear to auscultation       Cardiovascular Normal cardiovascular exam Rhythm:regular Rate:Normal     Neuro/Psych  Neuromuscular disease    GI/Hepatic PUD,   Endo/Other    Renal/GU      Musculoskeletal   Abdominal   Peds  Hematology   Anesthesia Other Findings   Reproductive/Obstetrics                             Anesthesia Physical Anesthesia Plan  ASA: II  Anesthesia Plan: General ETT   Post-op Pain Management:    Induction:   PONV Risk Score and Plan: 2 and Ondansetron, Dexamethasone, Scopolamine patch - Pre-op, Treatment may vary due to age or medical condition and Midazolam  Airway Management Planned:   Additional Equipment:   Intra-op Plan:   Post-operative Plan:   Informed Consent: I have reviewed the patients History and Physical, chart, labs and discussed the procedure including the risks, benefits and alternatives for the proposed anesthesia with the patient or authorized representative who has indicated his/her understanding and acceptance.       Plan Discussed with: CRNA  Anesthesia Plan Comments:         Anesthesia Quick Evaluation

## 2019-07-16 NOTE — Anesthesia Procedure Notes (Addendum)
Procedure Name: Intubation Date/Time: 07/16/2019 11:01 AM Performed by: Georga Bora, CRNA Pre-anesthesia Checklist: Patient identified, Emergency Drugs available, Suction available, Patient being monitored and Timeout performed Patient Re-evaluated:Patient Re-evaluated prior to induction Oxygen Delivery Method: Circle system utilized Preoxygenation: Pre-oxygenation with 100% oxygen Induction Type: IV induction Ventilation: Mask ventilation without difficulty Laryngoscope Size: Mac and 4 Grade View: Grade II Tube type: Oral Rae Tube size: 7.5 mm Number of attempts: 1 Airway Equipment and Method: Stylet Placement Confirmation: ETT inserted through vocal cords under direct vision,  positive ETCO2 and breath sounds checked- equal and bilateral Tube secured with: Tape Dental Injury: Teeth and Oropharynx as per pre-operative assessment, Injury to lip and Injury to tongue  Comments: Mild laceration on right upper lip and mid-tongue. Anterior.

## 2019-07-16 NOTE — Op Note (Signed)
PREOPERATIVE DIAGNOSIS:  Hypertrophy of tonsils  POSTOPERATIVE DIAGNOSIS:  Chronic Tonsillitis  OPERATION:  Tonsillectomy.  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  OPERATIVE FINDINGS:  Large tonsils.  3 mm papilloma right anterior tonsillar pillar  DESCRIPTION OF THE PROCEDURE: Colton Johnston was identified in the holding area and taken to the operating room and placed in the supine position.  After general endotracheal anesthesia, the table was turned 45 degrees and the patient was draped in the usual fashion for a tonsillectomy.  A mouth gag was inserted into the oral cavity.  There were large tonsils.   There was also a 3 mm papilloma on the right anterior tonsillar pillar which was removed using sharp scissors.  Beginning on the left-hand side a tenaculum was used to grasp the tonsil and the Bovie cautery was used to dissect it free from the fossa.  In a similar fashion, the right tonsil was removed.  Meticulous hemostasis was achieved using the Bovie cautery.  With both tonsils removed and no active bleeding, 0.5% plain Marcaine was used to inject the anterior and posterior tonsillar pillars bilaterally.  A total of 41ml was used.  The patient tolerated the procedure well and was awakened in the operating room and taken to the recovery room in stable condition.   CULTURES:  None.  SPECIMENS:  Tonsils.  Right anterior tonsillar pillar papilloma  ESTIMATED BLOOD LOSS:  Less than 10 ml.  Roena Malady  07/16/2019  11:12 AM

## 2019-07-16 NOTE — Anesthesia Postprocedure Evaluation (Signed)
Anesthesia Post Note  Patient: Colton Johnston  Procedure(s) Performed: TONSILLECTOMY (Bilateral Throat)     Patient location during evaluation: PACU Anesthesia Type: General Level of consciousness: awake and alert and oriented Pain management: satisfactory to patient Vital Signs Assessment: post-procedure vital signs reviewed and stable Respiratory status: spontaneous breathing, nonlabored ventilation and respiratory function stable Cardiovascular status: blood pressure returned to baseline and stable Postop Assessment: Adequate PO intake and No signs of nausea or vomiting Anesthetic complications: no    Raliegh Ip

## 2019-07-16 NOTE — Transfer of Care (Signed)
Immediate Anesthesia Transfer of Care Note  Patient: Colton Johnston  Procedure(s) Performed: TONSILLECTOMY (Bilateral Throat)  Patient Location: PACU  Anesthesia Type: General ETT  Level of Consciousness: awake, alert  and patient cooperative  Airway and Oxygen Therapy: Patient Spontanous Breathing and Patient connected to supplemental oxygen  Post-op Assessment: Post-op Vital signs reviewed, Patient's Cardiovascular Status Stable, Respiratory Function Stable, Patent Airway and No signs of Nausea or vomiting  Post-op Vital Signs: Reviewed and stable  Complications: No apparent anesthesia complications

## 2019-07-19 ENCOUNTER — Encounter: Payer: Self-pay | Admitting: *Deleted

## 2019-07-20 LAB — SURGICAL PATHOLOGY

## 2020-02-08 ENCOUNTER — Other Ambulatory Visit: Payer: Self-pay

## 2020-02-08 ENCOUNTER — Other Ambulatory Visit
Admission: RE | Admit: 2020-02-08 | Discharge: 2020-02-08 | Disposition: A | Discharge status: Home / Self Care | Payer: 59 | Source: Ambulatory Visit | Attending: Internal Medicine | Admitting: Internal Medicine

## 2020-02-08 DIAGNOSIS — Z20822 Contact with and (suspected) exposure to covid-19: Secondary | ICD-10-CM | POA: Insufficient documentation

## 2020-02-08 DIAGNOSIS — Z01812 Encounter for preprocedural laboratory examination: Secondary | ICD-10-CM | POA: Diagnosis present

## 2020-02-08 LAB — SARS CORONAVIRUS 2 (TAT 6-24 HRS): SARS Coronavirus 2: NEGATIVE

## 2020-02-09 ENCOUNTER — Ambulatory Visit: Payer: 59 | Admitting: Anesthesiology

## 2020-02-09 ENCOUNTER — Encounter
Admission: RE | Disposition: A | Discharge status: Home / Self Care | Payer: Self-pay | Source: Home / Self Care | Attending: Internal Medicine

## 2020-02-09 ENCOUNTER — Ambulatory Visit
Admission: RE | Admit: 2020-02-09 | Discharge: 2020-02-09 | Disposition: A | Discharge status: Home / Self Care | Payer: 59 | Attending: Internal Medicine | Admitting: Internal Medicine

## 2020-02-09 ENCOUNTER — Other Ambulatory Visit: Payer: Self-pay

## 2020-02-09 ENCOUNTER — Encounter: Payer: Self-pay | Admitting: Internal Medicine

## 2020-02-09 DIAGNOSIS — Z79899 Other long term (current) drug therapy: Secondary | ICD-10-CM | POA: Insufficient documentation

## 2020-02-09 DIAGNOSIS — Z85038 Personal history of other malignant neoplasm of large intestine: Secondary | ICD-10-CM | POA: Diagnosis not present

## 2020-02-09 DIAGNOSIS — Z86010 Personal history of colonic polyps: Secondary | ICD-10-CM | POA: Diagnosis not present

## 2020-02-09 DIAGNOSIS — Z791 Long term (current) use of non-steroidal anti-inflammatories (NSAID): Secondary | ICD-10-CM | POA: Diagnosis not present

## 2020-02-09 DIAGNOSIS — Z980 Intestinal bypass and anastomosis status: Secondary | ICD-10-CM | POA: Diagnosis not present

## 2020-02-09 DIAGNOSIS — Z1211 Encounter for screening for malignant neoplasm of colon: Secondary | ICD-10-CM | POA: Diagnosis not present

## 2020-02-09 HISTORY — PX: COLONOSCOPY WITH PROPOFOL: SHX5780

## 2020-02-09 SURGERY — COLONOSCOPY WITH PROPOFOL
Anesthesia: General

## 2020-02-09 MED ORDER — LIDOCAINE HCL (PF) 2 % IJ SOLN
INTRAMUSCULAR | Status: AC
  Filled 2020-02-09: qty 5

## 2020-02-09 MED ORDER — PROPOFOL 500 MG/50ML IV EMUL
INTRAVENOUS | Status: DC | PRN
  Administered 2020-02-09: 125 ug/kg/min via INTRAVENOUS

## 2020-02-09 MED ORDER — SODIUM CHLORIDE 0.9 % IV SOLN: INTRAVENOUS | Status: DC

## 2020-02-09 MED ORDER — LIDOCAINE HCL (CARDIAC) PF 100 MG/5ML IV SOSY
PREFILLED_SYRINGE | INTRAVENOUS | Status: DC | PRN
  Administered 2020-02-09: 30 mg via INTRAVENOUS

## 2020-02-09 MED ORDER — PROPOFOL 10 MG/ML IV BOLUS
INTRAVENOUS | Status: DC | PRN
  Administered 2020-02-09: 50 mg via INTRAVENOUS

## 2020-02-09 MED ORDER — PROPOFOL 500 MG/50ML IV EMUL
INTRAVENOUS | Status: AC
  Filled 2020-02-09: qty 50

## 2020-02-09 NOTE — Anesthesia Preprocedure Evaluation (Signed)
Anesthesia Evaluation  Patient identified by MRN, date of birth, ID band Patient awake    Reviewed: Allergy & Precautions, NPO status , Patient's Chart, lab work & pertinent test results  Airway Mallampati: II       Dental  (+) Chipped, Teeth Intact   Pulmonary neg pulmonary ROS,    Pulmonary exam normal        Cardiovascular negative cardio ROS   Rhythm:Regular Rate:Normal     Neuro/Psych  Neuromuscular disease negative psych ROS   GI/Hepatic Neg liver ROS, PUD, GERD  ,  Endo/Other  negative endocrine ROS  Renal/GU negative Renal ROS     Musculoskeletal   Abdominal Normal abdominal exam  (+)   Peds negative pediatric ROS (+)  Hematology negative hematology ROS (+)   Anesthesia Other Findings   Reproductive/Obstetrics                             Anesthesia Physical  Anesthesia Plan  ASA: II  Anesthesia Plan: General   Post-op Pain Management:    Induction: Intravenous  PONV Risk Score and Plan: Propofol infusion  Airway Management Planned: Natural Airway and Nasal Cannula  Additional Equipment:   Intra-op Plan:   Post-operative Plan:   Informed Consent: I have reviewed the patients History and Physical, chart, labs and discussed the procedure including the risks, benefits and alternatives for the proposed anesthesia with the patient or authorized representative who has indicated his/her understanding and acceptance.       Plan Discussed with: CRNA  Anesthesia Plan Comments:         Anesthesia Quick Evaluation

## 2020-02-09 NOTE — H&P (Signed)
Outpatient short stay form Pre-procedure 02/09/2020 10:39 AM Lavalle Skoda K. Alice Reichert, M.D.  Primary Physician: Daiva Eves, M.D.  Reason for visit:  Colon cancer screening  History of present illness:  Patient presents for colonoscopy for colon cancer screening. The patient denies complaints of abdominal pain, significant change in bowel habits, or rectal bleeding.      Current Facility-Administered Medications:  .  0.9 %  sodium chloride infusion, , Intravenous, Continuous, Halls, Benay Pike, MD, Last Rate: 20 mL/hr at 02/09/20 0937, New Bag at 02/09/20 5465  Medications Prior to Admission  Medication Sig Dispense Refill Last Dose  . pantoprazole (PROTONIX) 40 MG tablet   11 Past Month at Unknown time  . traMADol (ULTRAM) 50 MG tablet    Past Month at Unknown time  . amoxicillin (AMOXIL) 875 MG tablet  (Patient not taking: Reported on 02/09/2020)  0 Completed Course at Unknown time  . doxycycline (VIBRA-TABS) 100 MG tablet  (Patient not taking: Reported on 02/09/2020)  0 Completed Course at Unknown time  . Efinaconazole (JUBLIA) 10 % SOLN Apply 1 drop each affected toenail once daily for 12 months (Patient not taking: Reported on 07/08/2019) 8 mL 6   . Efinaconazole 10 % SOLN Apply 1 drop each affected toenail once daily for 12 months     . fluticasone (FLONASE) 50 MCG/ACT nasal spray Place into the nose.     . gabapentin (NEURONTIN) 100 MG capsule Take 100 mg by mouth 3 (three) times daily.     Marland Kitchen HYDROcodone-acetaminophen (HYCET) 7.5-325 mg/15 ml solution Take 15 mLs by mouth 4 (four) times daily as needed for moderate pain. 450 mL 0   . Luliconazole (LUZU) 1 % CREA Apply to affected areas twice daily for 2-4 weeks (Patient not taking: Reported on 07/08/2019) 60 g 2   . Luliconazole 1 % CREA Apply to affected areas twice daily for 2-4 weeks     . methocarbamol (ROBAXIN) 750 MG tablet  (Patient not taking: Reported on 02/09/2020)   Completed Course at Unknown time  . naproxen sodium (ANAPROX) 220 MG  tablet Take by mouth.     . penicillin v potassium (VEETID) 500 MG tablet Take 500 mg by mouth 2 (two) times daily.  0      No Known Allergies   Past Medical History:  Diagnosis Date  . Colon cancer ([place]) 2012   Partial colon resection.   . Muscle pain   . 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 Ulcer     Review of systems:  Otherwise negative.    Physical Exam  Gen: Alert, oriented. Appears stated age.  HEENT: [place]/AT. PERRLA. Lungs: CTA, no wheezes. CV: RR nl S1, S2. Abd: soft, benign, no masses. BS+ Ext: No edema. Pulses 2+    Planned procedures: Proceed with colonoscopy. The patient understands the nature of the planned procedure, indications, risks, alternatives and potential complications including but not limited to bleeding, infection, perforation, damage to internal organs and possible oversedation/side effects from anesthesia. The patient agrees and gives consent to proceed.  Please refer to procedure notes for findings, recommendations and patient disposition/instructions.     Jakaree Pickard K. Alice Reichert, M.D. Gastroenterology 02/09/2020  10:39 AM

## 2020-02-09 NOTE — Op Note (Signed)
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 Makenze Ellett MD, MD Medicines:             Propofol per Anesthesia Complications:         No immediate complications. Procedure:             Pre-Anesthesia Assessment:                        - The risks and benefits of the procedure and the                         sedation options and risks were discussed with the                         patient. All questions were answered and informed                         consent was obtained.                        - Patient identification and proposed procedure were                         verified prior to the procedure by the nurse. The                         procedure was verified in the procedure room.                        - ASA Grade Assessment: II - A patient with mild                         systemic disease.                        - After reviewing the risks and benefits, the patient                         was deemed in satisfactory condition to undergo the                         procedure.                        After obtaining informed consent, the colonoscope was  passed under direct vision. Throughout the procedure,                         the patient's blood pressure, pulse, and oxygen                         saturations were monitored continuously. The                         Colonoscope was introduced through the anus and                         advanced to the the cecum,  identified by appendiceal                         orifice and ileocecal valve. The colonoscopy was                         performed without difficulty. The patient tolerated                         the procedure well. The quality of the bowel                         preparation was good. The ileocecal valve, appendiceal                         orifice, and rectum were photographed. Findings:      The perianal and digital rectal examinations were normal. Pertinent       negatives include normal sphincter tone and no palpable rectal lesions.      A tattoo was seen in the rectum. The tattoo site appeared normal.      There was evidence of a prior end-to-side colo-colonic anastomosis in       the sigmoid colon. This was patent and was characterized by healthy       appearing mucosa. The anastomosis was traversed.      The exam was otherwise without abnormality on direct and retroflexion       views. Impression:            - A tattoo was seen in the rectum. The tattoo site                         appeared normal.                        - Patent end-to-side colo-colonic anastomosis,                         characterized by healthy appearing mucosa.                        - The examination was otherwise normal on direct and                         retroflexion views.                        - No specimens collected. Recommendation:        - Patient has  a contact number available for                         emergencies. The signs and symptoms of potential                         delayed complications were discussed with the patient.                         Return to normal activities tomorrow. Written                         discharge instructions were provided to the patient.                        - Resume previous diet.                        - Continue present medications.                        - Repeat colonoscopy in 3 years for surveillance.                        - Return to GI office  PRN.                        - The findings and recommendations were discussed with                         the patient. Procedure Code(s):     --- Professional ---                        U6333, Colorectal cancer screening; colonoscopy on                         individual at high risk Diagnosis Code(s):     --- Professional ---                        Z98.0, Intestinal bypass and anastomosis status                        Z85.038, Personal history of other malignant neoplasm                         of large intestine CPT copyright 2019 [employer]. All rights reserved. The codes documented in this report are preliminary and upon coder review may  be revised to meet current compliance requirements. Efrain Sella MD, MD 02/09/2020 11:15:30 AM This report has been signed electronically. Number of Addenda: 0 Note Initiated On: 02/09/2020 10:43 AM Scope Withdrawal Time: 0 hours 5 minutes 30 seconds  Total Procedure Duration: 0 hours 9 minutes 30 seconds  Estimated Blood Loss:  Estimated blood loss: none.      [facility]

## 2020-02-09 NOTE — Transfer of Care (Signed)
Immediate Anesthesia Transfer of Care Note  Patient: Colton Johnston  Procedure(s) Performed: COLONOSCOPY WITH PROPOFOL (N/A )  Patient Location: PACU and Endoscopy Unit  Anesthesia Type:General  Level of Consciousness: awake  Airway & Oxygen Therapy: Patient Spontanous Breathing  Post-op Assessment: Report given to RN  Post vital signs: stable  Last Vitals:  Vitals Value Taken Time  BP 113/77 02/09/20 1115  Temp 36.7 C 02/09/20 1115  Pulse 64 02/09/20 1116  Resp 15 02/09/20 1116  SpO2 100 % 02/09/20 1116  Vitals shown include unvalidated device data.  Last Pain:  Vitals:   02/09/20 1115  TempSrc: Temporal  PainSc: 0-No pain         Complications: No complications documented.

## 2020-02-09 NOTE — Interval H&P Note (Signed)
History and Physical Interval Note:  02/09/2020 10:41 AM  Colton Johnston  has presented today for surgery, with the diagnosis of HX CCA HX POLYPS.  The various methods of treatment have been discussed with the patient and family. After consideration of risks, benefits and other options for treatment, the patient has consented to  Procedure(s): COLONOSCOPY WITH PROPOFOL (N/A) as a surgical intervention.  The patient's history has been reviewed, patient examined, no change in status, stable for surgery.  I have reviewed the patient's chart and labs.  Questions were answered to the patient's satisfaction.     [place], [place]

## 2020-02-10 ENCOUNTER — Encounter: Payer: Self-pay | Admitting: Internal Medicine

## 2020-02-10 NOTE — Anesthesia Postprocedure Evaluation (Signed)
Anesthesia Post Note  Patient: Colton Johnston  Procedure(s) Performed: COLONOSCOPY WITH PROPOFOL (N/A )  Patient location during evaluation: Endoscopy Anesthesia Type: General Level of consciousness: awake and alert and oriented Pain management: pain level controlled Vital Signs Assessment: post-procedure vital signs reviewed and stable Respiratory status: spontaneous breathing Cardiovascular status: blood pressure returned to baseline Anesthetic complications: no   No complications documented.   Last Vitals:  Vitals:   02/09/20 0916 02/09/20 1115  BP: 128/90 113/77  Pulse: (!) 56 66  Resp: 16 15  Temp: (!) 36.3 C 36.7 C  SpO2: 99% 100%    Last Pain:  Vitals:   02/10/20 0741  TempSrc:   PainSc: 0-No pain                 Kamani Magnussen

## 2021-01-18 IMAGING — CT CT ABDOMEN AND PELVIS WITH CONTRAST
2 of 5 series · 14 of 46 positions shown, 16 images · IV contrast (omnipaque)
Comparison: August 28, 2015

CLINICAL DATA: Lower abdominal pain with nausea. History of colon
carcinoma

EXAM:
CT ABDOMEN AND PELVIS WITH CONTRAST
TECHNIQUE: Multidetector CT imaging of the abdomen and pelvis was performed
using the standard protocol following bolus administration of
intravenous contrast. Oral contrast was also administered.
CONTRAST:  75mL OMNIPAQUE IOHEXOL 300 MG/ML  SOLN

[Series 2: abd pelvis · axial · 0.61mm/px · z∈[-1473,-1078]mm · 11 of 91 slices shown, 13 images]
[im 6/91  soft-tissue]
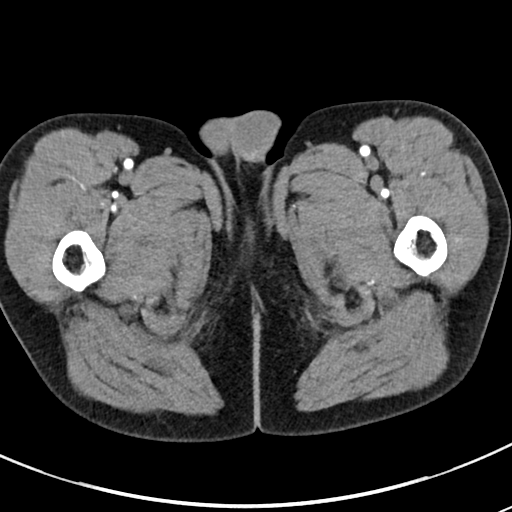
[im 6/91  bone]
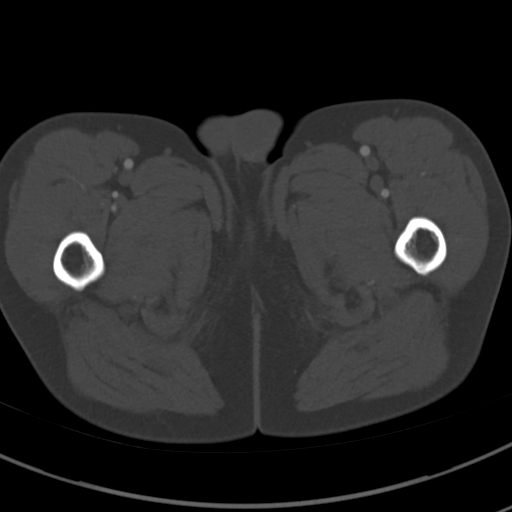
[im 16/91  soft-tissue]
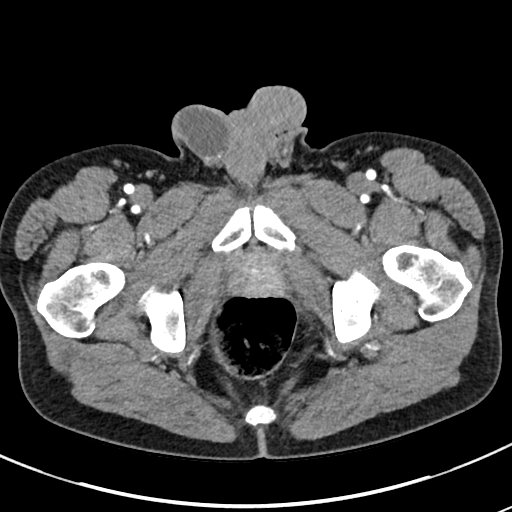
[im 22/91  soft-tissue]
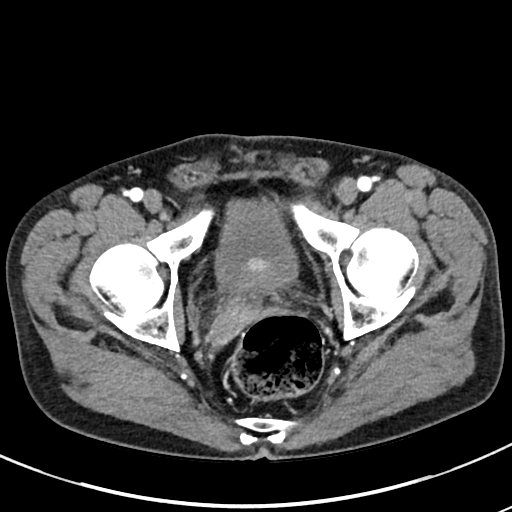
[im 32/91  soft-tissue]
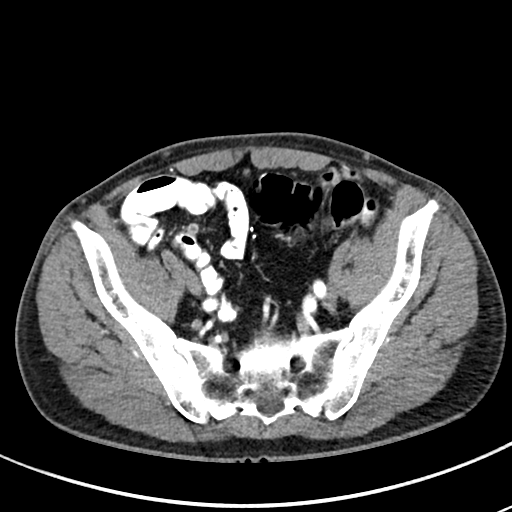
[im 38/91  soft-tissue]
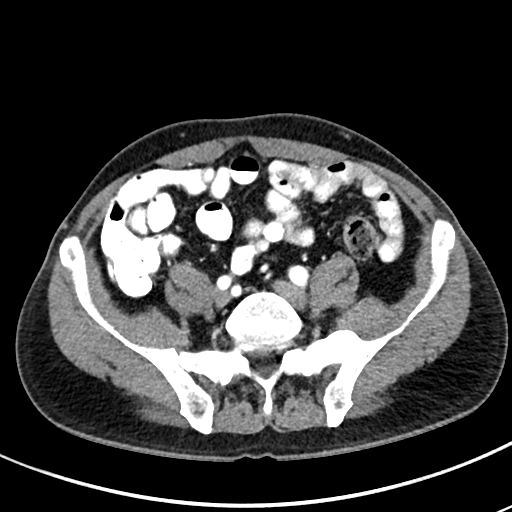
[im 48/91  soft-tissue]
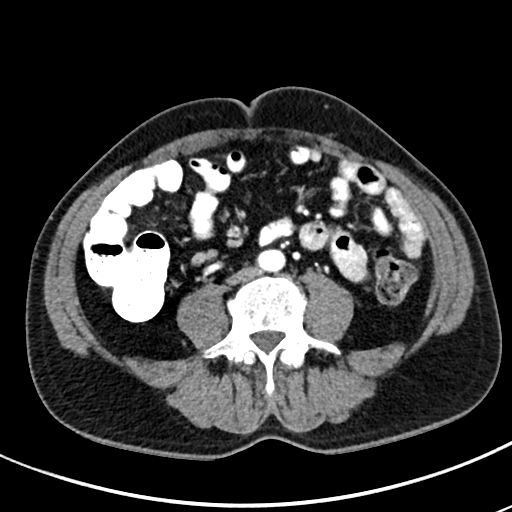
[im 53/91  soft-tissue]
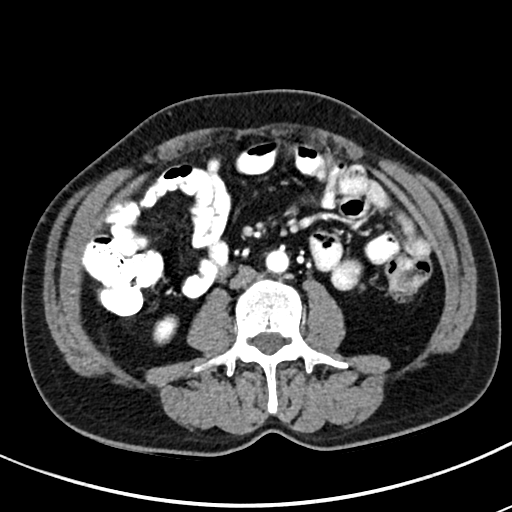
[im 59/91  soft-tissue]
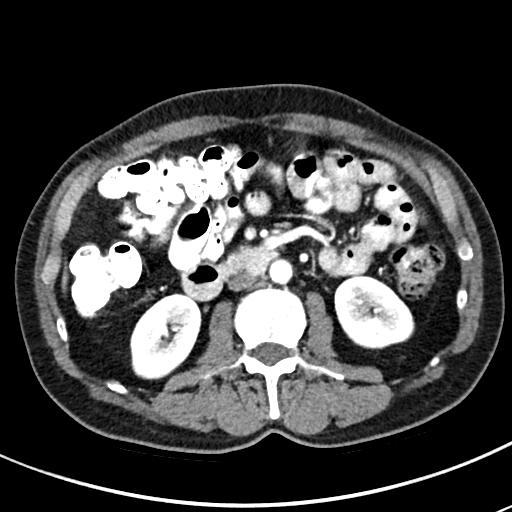
[im 69/91  soft-tissue]
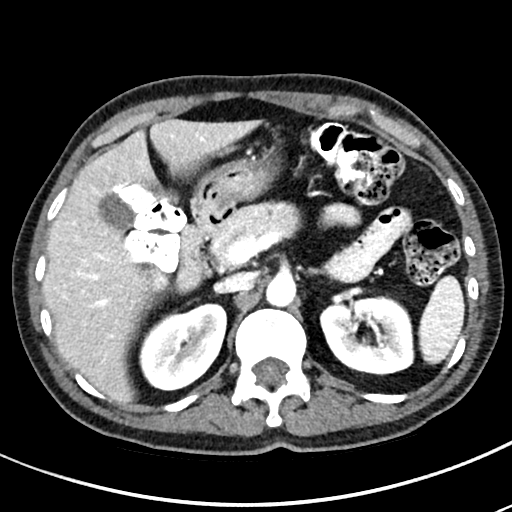
[im 69/91  bone]
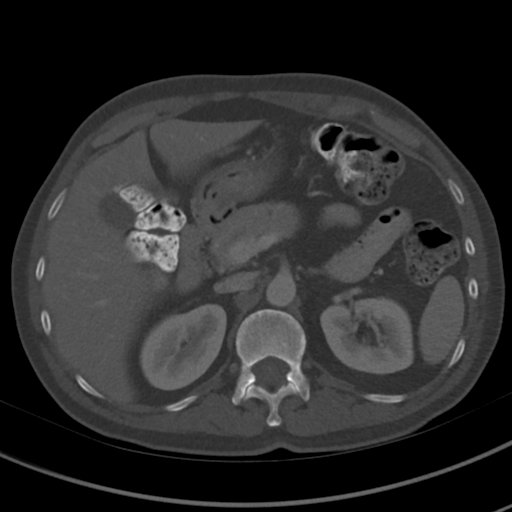
[im 75/91  soft-tissue]
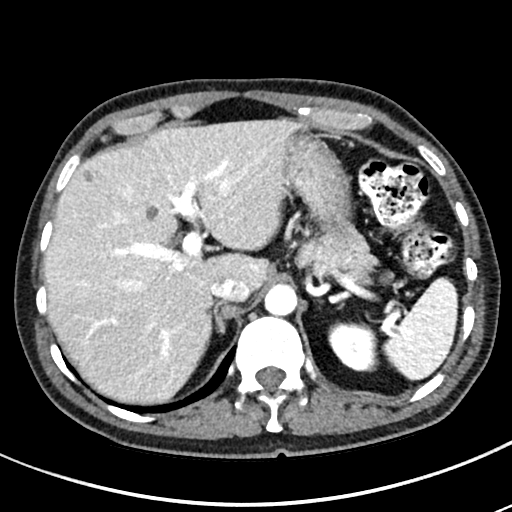
[im 85/91  soft-tissue]
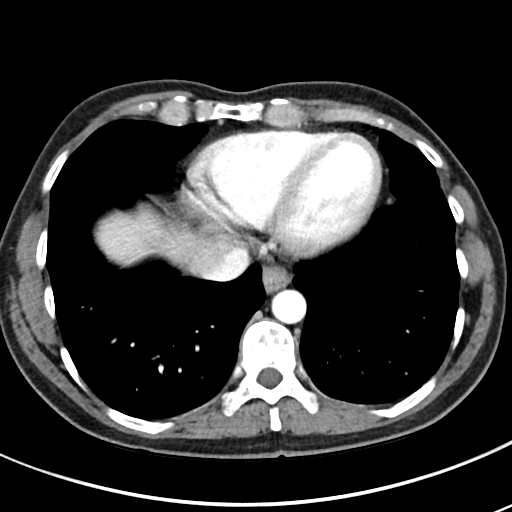

[Series 4: coronal abd pelvis · coronal · 0.61mm/px · 3 of 120 slices shown]
[im 40/120  soft-tissue]
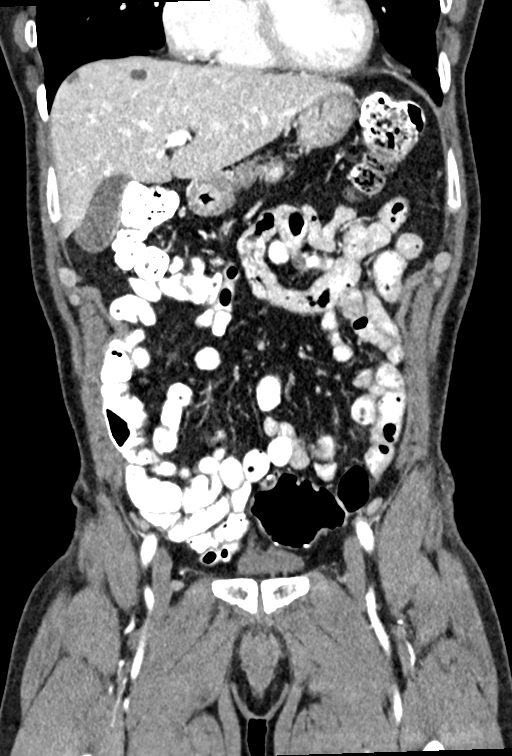
[im 53/120  soft-tissue]
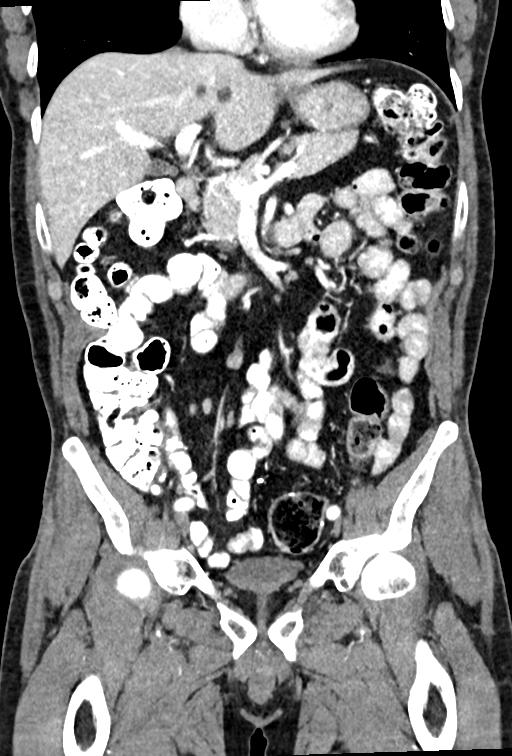
[im 67/120  soft-tissue]
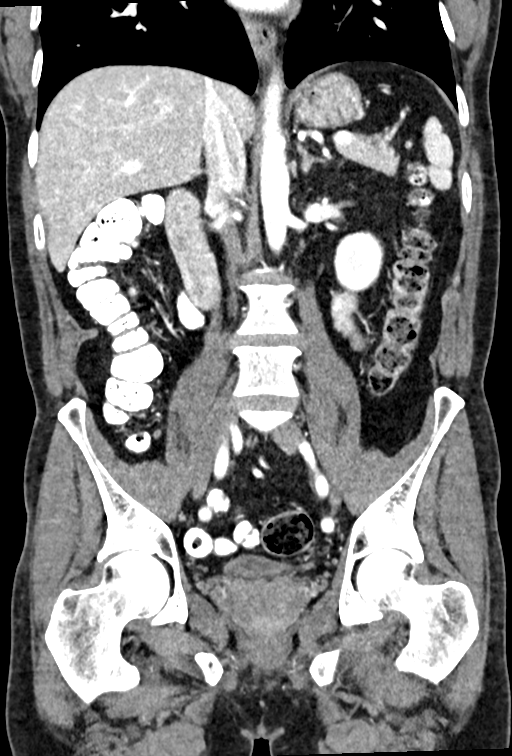

[14 of 46 positions shown; findings below may reference images not displayed]

FINDINGS: Lower chest: There is mild bibasilar atelectasis. There is no lung
base edema or consolidation. There is a small granuloma in the
posterior left base.

Hepatobiliary: Scattered cysts are noted throughout the liver, a
finding also present on previous study. Largest liver cyst measures
1.2 cm in maximum diameter. No noncystic liver lesions are evident.
The gallbladder wall is not appreciably thickened. There is no
biliary duct dilatation.

Pancreas: There is no pancreatic mass or inflammatory focus.

Spleen: No splenic lesions are evident.

Adrenals/Urinary Tract: Adrenals bilaterally appear unremarkable.
There is an 8 mm cyst in the upper pole of the left kidney. There is
a 1 cm cyst in the posterior mid right kidney. There is no evident
hydronephrosis on either side. There is no appreciable renal or
ureteral calculus on either side. Urinary bladder is min with wall
thickness within limits.

Stomach/Bowel: Patient has had previous surgery in the region of the
sigmoid colon with patent anastomosis. Rectum is mildly distended
with stool and air. There is no appreciable bowel wall or mesenteric
thickening. There is no evident bowel obstruction. There is no free
air or portal venous air. The terminal ileum appears unremarkable.

Vascular/Lymphatic: There is no abdominal aortic aneurysm. No
vascular lesions are appreciable. No adenopathy is evident in the
abdomen or pelvis.

Reproductive: Prostate and seminal vesicles are normal in size and
contour. Several prostatic calculi are noted. Mild enhancement in
the central region of the prostate is a stable finding. There is no
evident pelvic mass. There is an apparent right scrotal hydrocele.

Other: Appendix appears unremarkable. There is no evident abscess or
ascites in the abdomen or pelvis.

Musculoskeletal: There is an apparent bone island in the left
lateral ischium. No bony destruction or lytic bone lesions evident.
There is no intramuscular or abdominal wall lesion.
IMPRESSION: 1. A cause for patient's symptoms has not been established with this
study.

2. Postoperative change in the sigmoid colon region with patent
anastomosis. No bowel wall thickening or bowel obstruction. No
abscess in the abdomen pelvis. Appendix appears normal.

3. Persistent central enhancement in the prostate with several
prostatic calculi. Etiology and significance for this central
prostate enhancement uncertain. This finding may warrant clinical
assessment and PSA evaluation.

4.  Small right scrotal hydrocele present.

5. No evident renal or ureteral calculus. No hydronephrosis. Urinary
bladder wall thickness is within normal limits.

## 2022-01-05 ENCOUNTER — Other Ambulatory Visit: Payer: Self-pay | Admitting: Physician Assistant

## 2022-01-05 DIAGNOSIS — R519 Headache, unspecified: Secondary | ICD-10-CM

## 2022-01-05 DIAGNOSIS — G4486 Cervicogenic headache: Secondary | ICD-10-CM

## 2022-01-07 ENCOUNTER — Other Ambulatory Visit: Payer: Self-pay | Admitting: Physician Assistant

## 2022-01-07 DIAGNOSIS — M5481 Occipital neuralgia: Secondary | ICD-10-CM

## 2022-01-07 DIAGNOSIS — M542 Cervicalgia: Secondary | ICD-10-CM

## 2022-01-07 DIAGNOSIS — G4486 Cervicogenic headache: Secondary | ICD-10-CM

## 2022-01-07 DIAGNOSIS — G8929 Other chronic pain: Secondary | ICD-10-CM

## 2022-01-15 ENCOUNTER — Ambulatory Visit
Admission: RE | Admit: 2022-01-15 | Discharge: 2022-01-15 | Disposition: A | Discharge status: Home / Self Care | Payer: 59 | Source: Ambulatory Visit | Attending: Physician Assistant | Admitting: Physician Assistant

## 2022-01-15 DIAGNOSIS — G4486 Cervicogenic headache: Secondary | ICD-10-CM | POA: Insufficient documentation

## 2022-01-15 DIAGNOSIS — M5481 Occipital neuralgia: Secondary | ICD-10-CM | POA: Diagnosis present

## 2022-01-15 DIAGNOSIS — M542 Cervicalgia: Secondary | ICD-10-CM | POA: Diagnosis present

## 2022-01-15 DIAGNOSIS — R519 Headache, unspecified: Secondary | ICD-10-CM | POA: Diagnosis present

## 2022-01-15 DIAGNOSIS — G8929 Other chronic pain: Secondary | ICD-10-CM

## 2023-05-30 ENCOUNTER — Ambulatory Visit: Admission: RE | Admit: 2023-05-30 | Payer: 59 | Source: Home / Self Care

## 2023-05-30 SURGERY — COLONOSCOPY WITH PROPOFOL
Anesthesia: General

## 2023-12-18 ENCOUNTER — Encounter: Payer: Self-pay | Admitting: *Deleted

## 2023-12-26 ENCOUNTER — Encounter: Admission: RE | Payer: Self-pay | Source: Home / Self Care

## 2023-12-26 ENCOUNTER — Ambulatory Visit: Admission: RE | Admit: 2023-12-26 | Source: Home / Self Care

## 2023-12-26 HISTORY — DX: Malignant neoplasm of colon, unspecified: C18.9

## 2023-12-26 HISTORY — DX: Unspecified osteoarthritis, unspecified site: M19.90

## 2023-12-26 HISTORY — DX: Occipital neuralgia: M54.81

## 2023-12-26 HISTORY — DX: Benign neoplasm of colon, unspecified: D12.6

## 2023-12-26 HISTORY — DX: Chronic tension-type headache, intractable: G44.221

## 2023-12-26 HISTORY — DX: Headache, unspecified: R51.9

## 2023-12-26 HISTORY — DX: Essential (primary) hypertension: I10

## 2023-12-26 HISTORY — DX: Gastro-esophageal reflux disease without esophagitis: K21.9

## 2023-12-26 HISTORY — DX: Primary osteoarthritis, unspecified hand: M19.049

## 2023-12-26 SURGERY — COLONOSCOPY WITH PROPOFOL
Anesthesia: General
